# Patient Record
Sex: Female | Born: 1975 | Race: White | Hispanic: No | Marital: Married | Smoking: Never smoker
Health system: Southern US, Community
[De-identification: ages and names within clinical notes are randomized; demographics above are authoritative.]

## PROBLEM LIST (undated history)

## (undated) DIAGNOSIS — G43109 Migraine with aura, not intractable, without status migrainosus: Secondary | ICD-10-CM

## (undated) DIAGNOSIS — E669 Obesity, unspecified: Secondary | ICD-10-CM

## (undated) DIAGNOSIS — F988 Other specified behavioral and emotional disorders with onset usually occurring in childhood and adolescence: Secondary | ICD-10-CM

## (undated) HISTORY — DX: Other specified behavioral and emotional disorders with onset usually occurring in childhood and adolescence: F98.8

## (undated) HISTORY — DX: Migraine with aura, not intractable, without status migrainosus: G43.109

## (undated) HISTORY — DX: Obesity, unspecified: E66.9

---

## 2005-09-08 ENCOUNTER — Ambulatory Visit (HOSPITAL_COMMUNITY): Admission: RE | Admit: 2005-09-08 | Discharge: 2005-09-08 | Payer: Self-pay | Admitting: Obstetrics and Gynecology

## 2005-11-28 ENCOUNTER — Inpatient Hospital Stay (HOSPITAL_COMMUNITY): Admission: RE | Admit: 2005-11-28 | Discharge: 2005-11-28 | Payer: Self-pay | Admitting: Obstetrics and Gynecology

## 2005-12-07 ENCOUNTER — Inpatient Hospital Stay (HOSPITAL_COMMUNITY): Admission: AD | Admit: 2005-12-07 | Discharge: 2005-12-07 | Payer: Self-pay | Admitting: Obstetrics and Gynecology

## 2005-12-08 ENCOUNTER — Inpatient Hospital Stay (HOSPITAL_COMMUNITY): Admission: AD | Admit: 2005-12-08 | Discharge: 2005-12-10 | Payer: Self-pay | Admitting: Obstetrics & Gynecology

## 2006-01-14 ENCOUNTER — Other Ambulatory Visit: Admission: RE | Admit: 2006-01-14 | Discharge: 2006-01-14 | Payer: Self-pay | Admitting: Obstetrics and Gynecology

## 2007-02-25 ENCOUNTER — Ambulatory Visit (HOSPITAL_COMMUNITY): Admission: AD | Admit: 2007-02-25 | Discharge: 2007-02-25 | Payer: Self-pay | Admitting: Obstetrics & Gynecology

## 2007-05-23 ENCOUNTER — Inpatient Hospital Stay (HOSPITAL_COMMUNITY): Admission: AD | Admit: 2007-05-23 | Discharge: 2007-05-25 | Payer: Self-pay | Admitting: Obstetrics and Gynecology

## 2007-07-21 LAB — HM PAP SMEAR

## 2011-08-04 LAB — CBC
HCT: 35.9 — ABNORMAL LOW
HCT: 37.9
MCHC: 34.7
MCV: 88.5
MCV: 88.9
Platelets: 106 — ABNORMAL LOW
Platelets: 129 — ABNORMAL LOW
Platelets: 99 — ABNORMAL LOW
WBC: 11.4 — ABNORMAL HIGH
WBC: 9.7

## 2011-08-04 LAB — RH IMMUNE GLOB WKUP(>/=20WKS)(NOT WOMEN'S HOSP)

## 2011-08-04 LAB — RPR: RPR Ser Ql: NONREACTIVE

## 2011-08-04 LAB — RAPID HIV SCREEN (WH-MAU): Rapid HIV Screen: NONREACTIVE

## 2012-03-04 ENCOUNTER — Other Ambulatory Visit (HOSPITAL_BASED_OUTPATIENT_CLINIC_OR_DEPARTMENT_OTHER): Payer: Self-pay | Admitting: Family Medicine

## 2012-03-04 ENCOUNTER — Ambulatory Visit (HOSPITAL_BASED_OUTPATIENT_CLINIC_OR_DEPARTMENT_OTHER)
Admission: RE | Admit: 2012-03-04 | Discharge: 2012-03-04 | Disposition: A | Discharge status: Home / Self Care | Payer: 59 | Source: Ambulatory Visit | Attending: Family Medicine | Admitting: Family Medicine

## 2012-03-04 DIAGNOSIS — R05 Cough: Secondary | ICD-10-CM | POA: Insufficient documentation

## 2012-03-04 DIAGNOSIS — R059 Cough, unspecified: Secondary | ICD-10-CM | POA: Insufficient documentation

## 2012-11-03 ENCOUNTER — Other Ambulatory Visit: Payer: Self-pay | Admitting: Obstetrics and Gynecology

## 2012-11-03 DIAGNOSIS — R928 Other abnormal and inconclusive findings on diagnostic imaging of breast: Secondary | ICD-10-CM

## 2012-11-11 ENCOUNTER — Ambulatory Visit
Admission: RE | Admit: 2012-11-11 | Discharge: 2012-11-11 | Disposition: A | Discharge status: Home / Self Care | Payer: Managed Care, Other (non HMO) | Source: Ambulatory Visit | Attending: Obstetrics and Gynecology | Admitting: Obstetrics and Gynecology

## 2012-11-11 DIAGNOSIS — R928 Other abnormal and inconclusive findings on diagnostic imaging of breast: Secondary | ICD-10-CM

## 2013-01-17 ENCOUNTER — Telehealth: Payer: Self-pay | Admitting: *Deleted

## 2013-01-17 NOTE — Telephone Encounter (Signed)
PT DID SOMETHING TO HER BACK AND SHE WANTED TO KNOW IF SHE NEEDS TO BE REF. TO  A SPEC.?

## 2013-01-17 NOTE — Telephone Encounter (Signed)
Pt needed an appt. She will be seen tomorrow at 10:30 for her back pain. PG

## 2013-01-18 ENCOUNTER — Encounter: Payer: Self-pay | Admitting: Family Medicine

## 2013-01-18 ENCOUNTER — Ambulatory Visit (INDEPENDENT_AMBULATORY_CARE_PROVIDER_SITE_OTHER): Payer: Managed Care, Other (non HMO) | Admitting: Family Medicine

## 2013-01-18 VITALS — BP 102/70 | HR 66 | Wt 189.0 lb

## 2013-01-18 DIAGNOSIS — M549 Dorsalgia, unspecified: Secondary | ICD-10-CM

## 2013-01-18 MED ORDER — TIZANIDINE HCL 4 MG PO TABS: 4.0000 mg | ORAL_TABLET | Freq: Three times a day (TID) | ORAL | Status: DC

## 2013-01-18 MED ORDER — METHYLPREDNISOLONE 4 MG PO KIT: PACK | ORAL | Status: DC

## 2013-01-18 MED ORDER — TRAMADOL HCL 50 MG PO TABS: ORAL_TABLET | ORAL | Status: DC

## 2013-01-18 MED ORDER — KETOROLAC TROMETHAMINE 60 MG/2ML IM SOLN
60.0000 mg | Freq: Once | INTRAMUSCULAR | Status: AC
  Administered 2013-01-18: 60 mg via INTRAMUSCULAR

## 2013-01-18 MED ORDER — LIDOCAINE 5 % EX PTCH: MEDICATED_PATCH | CUTANEOUS | Status: DC

## 2013-01-18 MED ORDER — NABUMETONE 750 MG PO TABS: 750.0000 mg | ORAL_TABLET | Freq: Two times a day (BID) | ORAL | Status: DC

## 2013-01-18 MED ORDER — DICLOFENAC SODIUM 1 % TD GEL: TRANSDERMAL | Status: DC

## 2013-01-18 MED ORDER — METHYLPREDNISOLONE SODIUM SUCC 125 MG IJ SOLR
125.0000 mg | Freq: Once | INTRAMUSCULAR | Status: AC
  Administered 2013-01-18: 125 mg via INTRAMUSCULAR

## 2013-01-18 MED ORDER — METHYLPREDNISOLONE SODIUM SUCC 125 MG IJ SOLR: 125.0000 mg | Freq: Once | INTRAMUSCULAR | Status: DC

## 2013-01-18 NOTE — Progress Notes (Signed)
Subjective:     Patient ID: Anne Knight, female   DOB: 27-Nov-1975, 37 y.o.   MRN: [id number]  HPI Falicity is here today complaining of back pain.  She has been struggling with this problem for several days.  She describes her pain as sharp and severe in nature and it happens intermittently.  She has taken Motrin for her pain which has not helped her.   Review of Systems  Musculoskeletal: Positive for back pain. Negative for gait problem.  Neurological: Negative for weakness and numbness.       Objective:   Physical Exam  Musculoskeletal:       Lumbar back: She exhibits tenderness (Pain is greater in the area of the left SI joint ). She exhibits no swelling.       Assessment:     Back Pain     Plan:     She received injections of Ketorolac and Solu-Medrol. She was given prescriptions for several medications to try first and then go to if needed. If she is not better in 1 week, we may send her for PT then later for a MRI if she does not improve.

## 2013-01-18 NOTE — Patient Instructions (Addendum)
1)  Back Pain - You received injections of a steroid and an anti-inflammatory medication today therefore you won't take the Relafen today.  You can take the muscle relaxer (Tizanidine) along with Tylenol 1000 mg up to 3 times per day.  Soak in a hot tub with 2-3 cups of Epsom salt and do gentle stretching.  You have also been given other meds to take if needed in the future  A)  Voltaren Gel - This is an anti-inflammatory so interchange it with the Relafen.  B)  Medrol Dose Pak - This is a steroid Dose Pak you can take for 6 days.    C)  Lidoderm Patches - Pain  D)  Tramadol - Start with 1 tab at night for pain and increase slowly up to 3 if needed.    Thermacare Patches for Back  Back Magic    Back Exercises Back exercises help treat and prevent back injuries. The goal of back exercises is to increase the strength of your abdominal and back muscles and the flexibility of your back. These exercises should be started when you no longer have back pain. Back exercises include:  Pelvic Tilt. Lie on your back with your knees bent. Tilt your pelvis until the lower part of your back is against the floor. Hold this position 5 to 10 sec and repeat 5 to 10 times.  Knee to Chest. Pull first 1 knee up against your chest and hold for 20 to 30 seconds, repeat this with the other knee, and then both knees. This may be done with the other leg straight or bent, whichever feels better.  Sit-Ups or Curl-Ups. Bend your knees 90 degrees. Start with tilting your pelvis, and do a partial, slow sit-up, lifting your trunk only 30 to 45 degrees off the floor. Take at least 2 to 3 seconds for each sit-up. Do not do sit-ups with your knees out straight. If partial sit-ups are difficult, simply do the above but with only tightening your abdominal muscles and holding it as directed.  Hip-Lift. Lie on your back with your knees flexed 90 degrees. Push down with your feet and shoulders as you raise your hips a couple inches  off the floor; hold for 10 seconds, repeat 5 to 10 times.  Back arches. Lie on your stomach, propping yourself up on bent elbows. Slowly press on your hands, causing an arch in your low back. Repeat 3 to 5 times. Any initial stiffness and discomfort should lessen with repetition over time.  Shoulder-Lifts. Lie face down with arms beside your body. Keep hips and torso pressed to floor as you slowly lift your head and shoulders off the floor. Do not overdo your exercises, especially in the beginning. Exercises may cause you some mild back discomfort which lasts for a few minutes; however, if the pain is more severe, or lasts for more than 15 minutes, do not continue exercises until you see your caregiver. Improvement with exercise therapy for back problems is slow.  See your caregivers for assistance with developing a proper back exercise program. Document Released: 11/13/2004 Document Revised: 12/29/2011 Document Reviewed: 08/07/2011 [facility] Patient Information 2013 [place], [place].

## 2013-01-19 DIAGNOSIS — M549 Dorsalgia, unspecified: Secondary | ICD-10-CM | POA: Insufficient documentation

## 2013-08-23 ENCOUNTER — Ambulatory Visit (INDEPENDENT_AMBULATORY_CARE_PROVIDER_SITE_OTHER): Payer: Managed Care, Other (non HMO) | Admitting: Family Medicine

## 2013-08-23 ENCOUNTER — Encounter: Payer: Self-pay | Admitting: Family Medicine

## 2013-08-23 VITALS — BP 103/71 | HR 73 | Resp 16 | Ht 64.0 in | Wt 198.0 lb

## 2013-08-23 DIAGNOSIS — R41840 Attention and concentration deficit: Secondary | ICD-10-CM | POA: Insufficient documentation

## 2013-08-23 DIAGNOSIS — E785 Hyperlipidemia, unspecified: Secondary | ICD-10-CM

## 2013-08-23 DIAGNOSIS — G43909 Migraine, unspecified, not intractable, without status migrainosus: Secondary | ICD-10-CM

## 2013-08-23 DIAGNOSIS — R635 Abnormal weight gain: Secondary | ICD-10-CM | POA: Insufficient documentation

## 2013-08-23 DIAGNOSIS — Z5181 Encounter for therapeutic drug level monitoring: Secondary | ICD-10-CM

## 2013-08-23 MED ORDER — LISDEXAMFETAMINE DIMESYLATE 70 MG PO CAPS: 70.0000 mg | ORAL_CAPSULE | ORAL | Status: DC

## 2013-08-23 MED ORDER — TOPIRAMATE ER 50 MG PO CAP24: 1.0000 | ORAL_CAPSULE | Freq: Every day | ORAL | Status: DC

## 2013-08-23 NOTE — Assessment & Plan Note (Signed)
Refilled her Vyvanse 

## 2013-08-23 NOTE — Assessment & Plan Note (Signed)
Checking a lipid panel.  

## 2013-08-23 NOTE — Assessment & Plan Note (Signed)
Checking a CBC and CMET.

## 2013-08-23 NOTE — Patient Instructions (Signed)
1)  ADD - Continue on Vyvanse.  2)  Migraine Prevention   A)  Trokendi XR - Start with the 50 mg.  If you feel "foggy" try adding some L-tyrosine.  B)  Migrelief +/- Lysine   Migraine Headache A migraine headache is an intense, throbbing pain on one or both sides of your head. A migraine can last for 30 minutes to several hours. CAUSES  The exact cause of a migraine headache is not always known. However, a migraine may be caused when nerves in the brain become irritated and release chemicals that cause inflammation. This causes pain. SYMPTOMS  Pain on one or both sides of your head.  Pulsating or throbbing pain.  Severe pain that prevents daily activities.  Pain that is aggravated by any physical activity.  Nausea, vomiting, or both.  Dizziness.  Pain with exposure to bright lights, loud noises, or activity.  General sensitivity to bright lights, loud noises, or smells. Before you get a migraine, you may get warning signs that a migraine is coming (aura). An aura may include:  Seeing flashing lights.  Seeing bright spots, halos, or zig-zag lines.  Having tunnel vision or blurred vision.  Having feelings of numbness or tingling.  Having trouble talking.  Having muscle weakness. MIGRAINE TRIGGERS  Alcohol.  Smoking.  Stress.  Menstruation.  Aged cheeses.  Foods or drinks that contain nitrates, glutamate, aspartame, or tyramine.  Lack of sleep.  Chocolate.  Caffeine.  Hunger.  Physical exertion.  Fatigue.  Medicines used to treat chest pain (nitroglycerine), birth control pills, estrogen, and some blood pressure medicines. DIAGNOSIS  A migraine headache is often diagnosed based on:  Symptoms.  Physical examination.  A CT scan or MRI of your head. TREATMENT Medicines may be given for pain and nausea. Medicines can also be given to help prevent recurrent migraines.  HOME CARE INSTRUCTIONS  Only take over-the-counter or prescription medicines  for pain or discomfort as directed by your caregiver. The use of long-term narcotics is not recommended.  Lie down in a dark, quiet room when you have a migraine.  Keep a journal to find out what may trigger your migraine headaches. For example, write down:  What you eat and drink.  How much sleep you get.  Any change to your diet or medicines.  Limit alcohol consumption.  Quit smoking if you smoke.  Get 7 to 9 hours of sleep, or as recommended by your caregiver.  Limit stress.  Keep lights dim if bright lights bother you and make your migraines worse. SEEK IMMEDIATE MEDICAL CARE IF:   Your migraine becomes severe.  You have a fever.  You have a stiff neck.  You have vision loss.  You have muscular weakness or loss of muscle control.  You start losing your balance or have trouble walking.  You feel faint or pass out.  You have severe symptoms that are different from your first symptoms. MAKE SURE YOU:   Understand these instructions.  Will watch your condition.  Will get help right away if you are not doing well or get worse. Document Released: 10/06/2005 Document Revised: 12/29/2011 Document Reviewed: 09/26/2011 [facility] Patient Information 2014 [place], [place].

## 2013-08-23 NOTE — Assessment & Plan Note (Signed)
Checking a TSH.   

## 2013-08-23 NOTE — Progress Notes (Signed)
  Subjective:    Patient ID: Anne Knight, female    DOB: 03/31/1976, 37 y.o.   MRN: [id number]  HPI  Anne Knight is here today to get a refill on her ADD medication (Vyvanse 70 mg).  She says that the medication continues to help her concentrate at work.  She feels that this dosage is appropriate and would like to continue on it.  She is no longer taking the Topamax.  She says that it makes her have "brain fog".     Review of Systems  Constitutional: Positive for unexpected weight change.  HENT: Negative.   Eyes: Negative.   Respiratory: Negative.   Cardiovascular: Negative.   Gastrointestinal: Negative.   Endocrine: Negative.   Genitourinary: Negative.   Musculoskeletal: Negative.   Skin: Negative.   Allergic/Immunologic: Negative.   Neurological: Negative.   Hematological: Negative.   Psychiatric/Behavioral: Positive for decreased concentration.     Past Medical History  Diagnosis Date  . Migraine headache with aura   . ADD (attention deficit disorder)   . Obesity     Family History  Problem Relation Age of Onset  . Heart disease Maternal Grandmother   . Heart disease Paternal Grandfather      History   Social History Narrative   Marital Status:  Married ([place])   Children:  Sons (Carter/Brandon)     Pets: Dog (Buddy)    Living Situation: Lives with spouse and sons.    Occupation: Pharmacist, hospital)    Education:  Psychologist, forensic) [employer]   Tobacco Use/Exposure:  None    Alcohol Use:  Occasional (Wine/Beer)    Drug Use:  None   Diet:  Regular   Exercise:  Cardio (3 x per week)    Hobbies: Sports, Reading              Objective:   Physical Exam  Vitals reviewed. Constitutional: She is oriented to person, place, and time. She appears well-developed and well-nourished.  Eyes: Conjunctivae are normal. No scleral icterus.  Neck: Neck supple. No thyromegaly present.  Cardiovascular: Normal rate, regular rhythm and  normal heart sounds.   Pulmonary/Chest: Effort normal and breath sounds normal.  Musculoskeletal: She exhibits no edema and no tenderness.  Lymphadenopathy:    She has no cervical adenopathy.  Neurological: She is alert and oriented to person, place, and time.  Skin: Skin is warm and dry.  Psychiatric: She has a normal mood and affect. Her behavior is normal. Judgment and thought content normal.          Assessment & Plan:

## 2013-08-23 NOTE — Assessment & Plan Note (Signed)
She is going to try some Trokendi XR 50 mg vs Migrelief and L-Tyrosine.

## 2013-11-16 IMAGING — US US BREAST*R*
1 series · 4 of 4 positions shown · non-contrast
Comparison: October 28, 2012

CLINICAL DATA: Called back from screening mammogram for possible
mass right breast

DIGITAL DIAGNOSTIC RIGHT MAMMOGRAM November 11, 2012 AND RIGHT
BREAST ULTRASOUND:

[Series 1: us breast*right* · 4 of 4 slices shown]
[im 1/4]
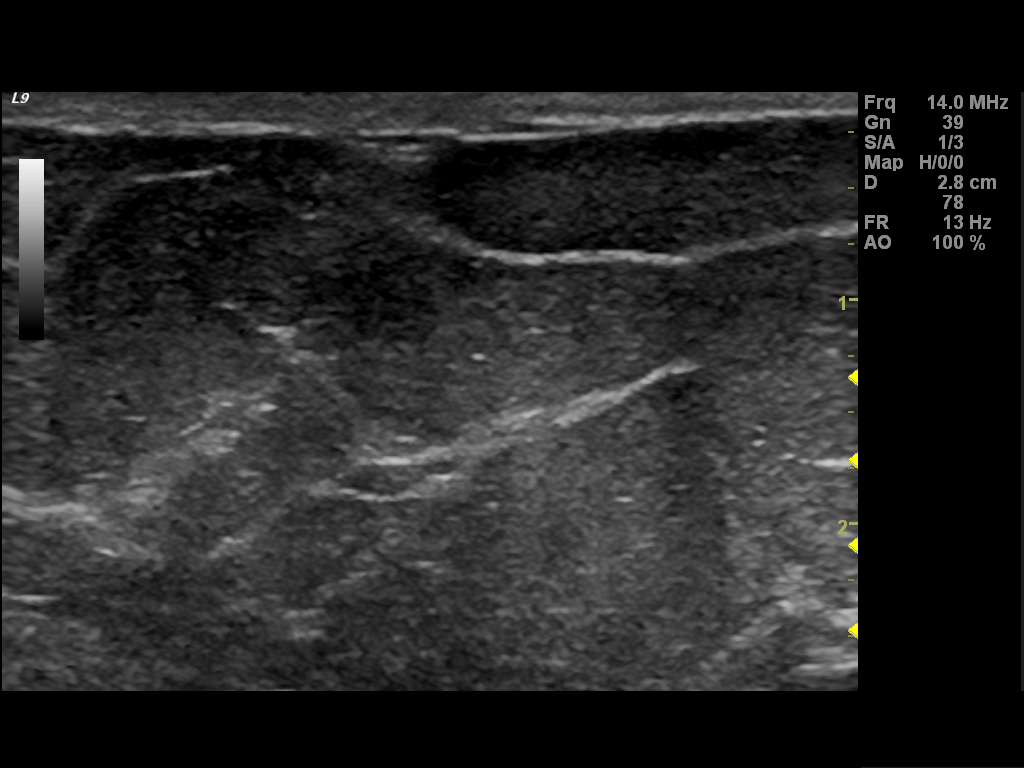
[im 2/4]
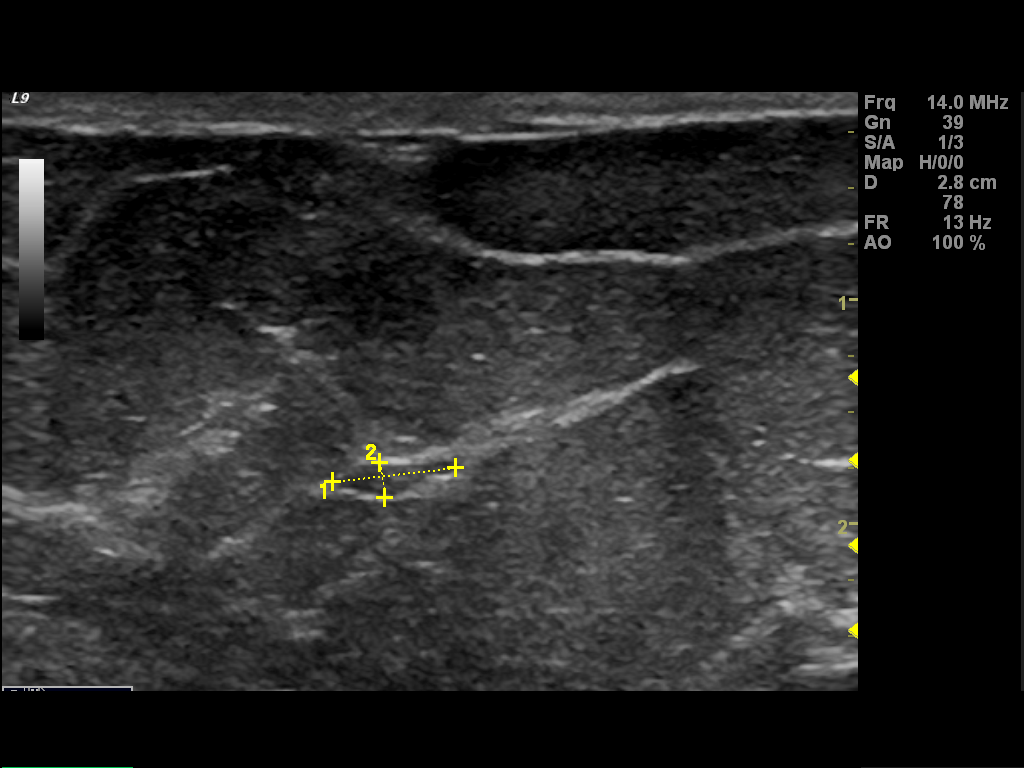
[im 3/4]
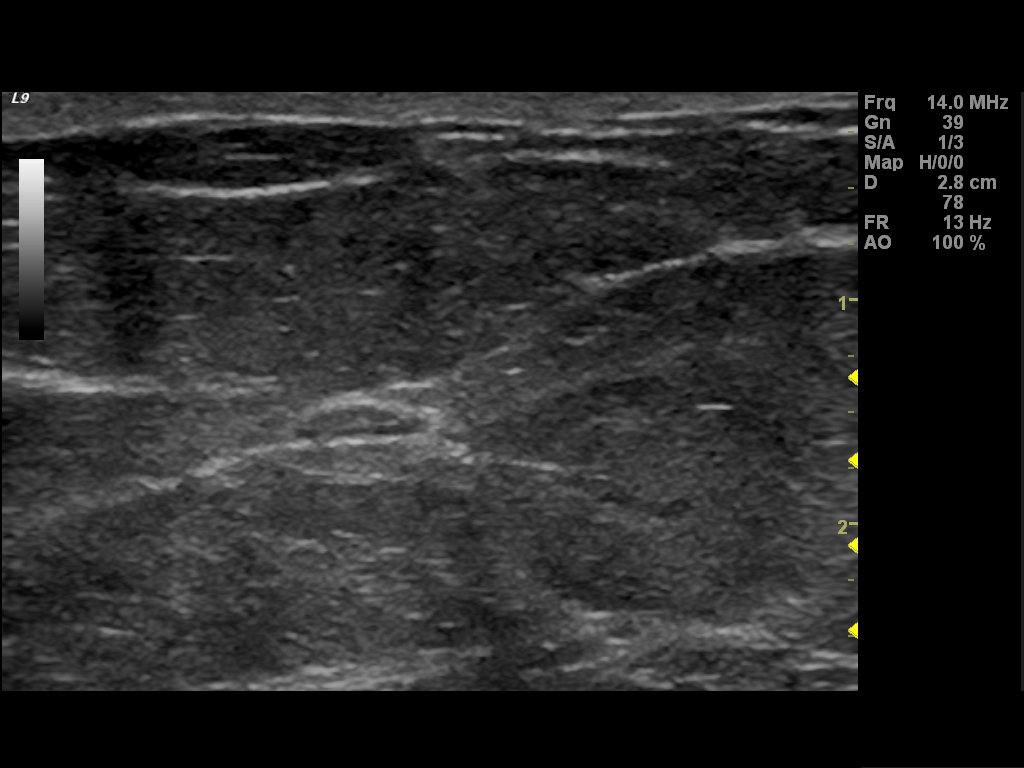
[im 4/4]
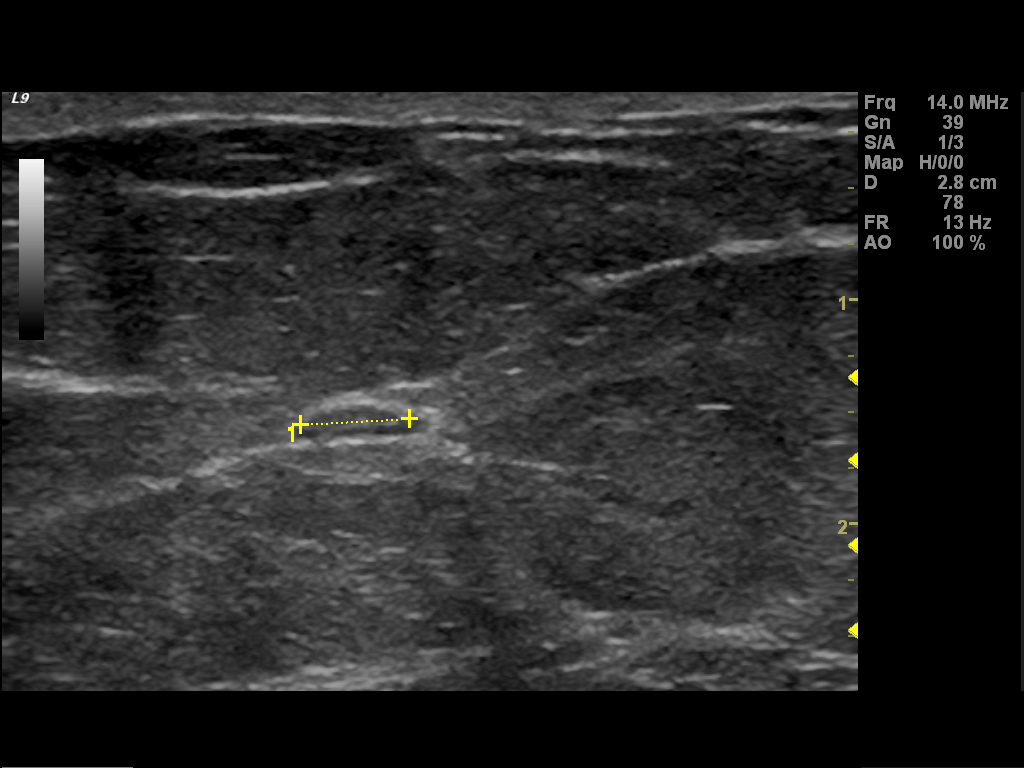

[4 of 4 positions shown; findings below may reference images not displayed]

FINDINGS: ACR Breast Density Category scattered fibroglandular tissue

Spot compression CC and MLO views of the right breast are
submitted.  Previously questioned asymmetry in the slightly medial
right breast persists.

 showing an oval hypoechoic lesion with central increased echo
texture at the right breast three o'clock subareolar measuring
x 0.16 x 0.49 cm.  This correlates to the mammographic finding and
is consistent with intramammary lymph node.
IMPRESSION: Benign findings.

RECOMMENDATION:
Routine screening mammogram at age 40.

I have discussed the findings and recommendations with the patient.
Results were also provided in writing at the conclusion of the
visit.

BI-RADS CATEGORY 2:  Benign finding(s).

## 2013-11-16 IMAGING — MG MM DIGITAL DIAGNOSTIC UNILAT*R*
2 series · 2 of 2 positions shown · non-contrast
Comparison: October 28, 2012

CLINICAL DATA: Called back from screening mammogram for possible
mass right breast

DIGITAL DIAGNOSTIC RIGHT MAMMOGRAM November 11, 2012 AND RIGHT
BREAST ULTRASOUND:

[R CC]
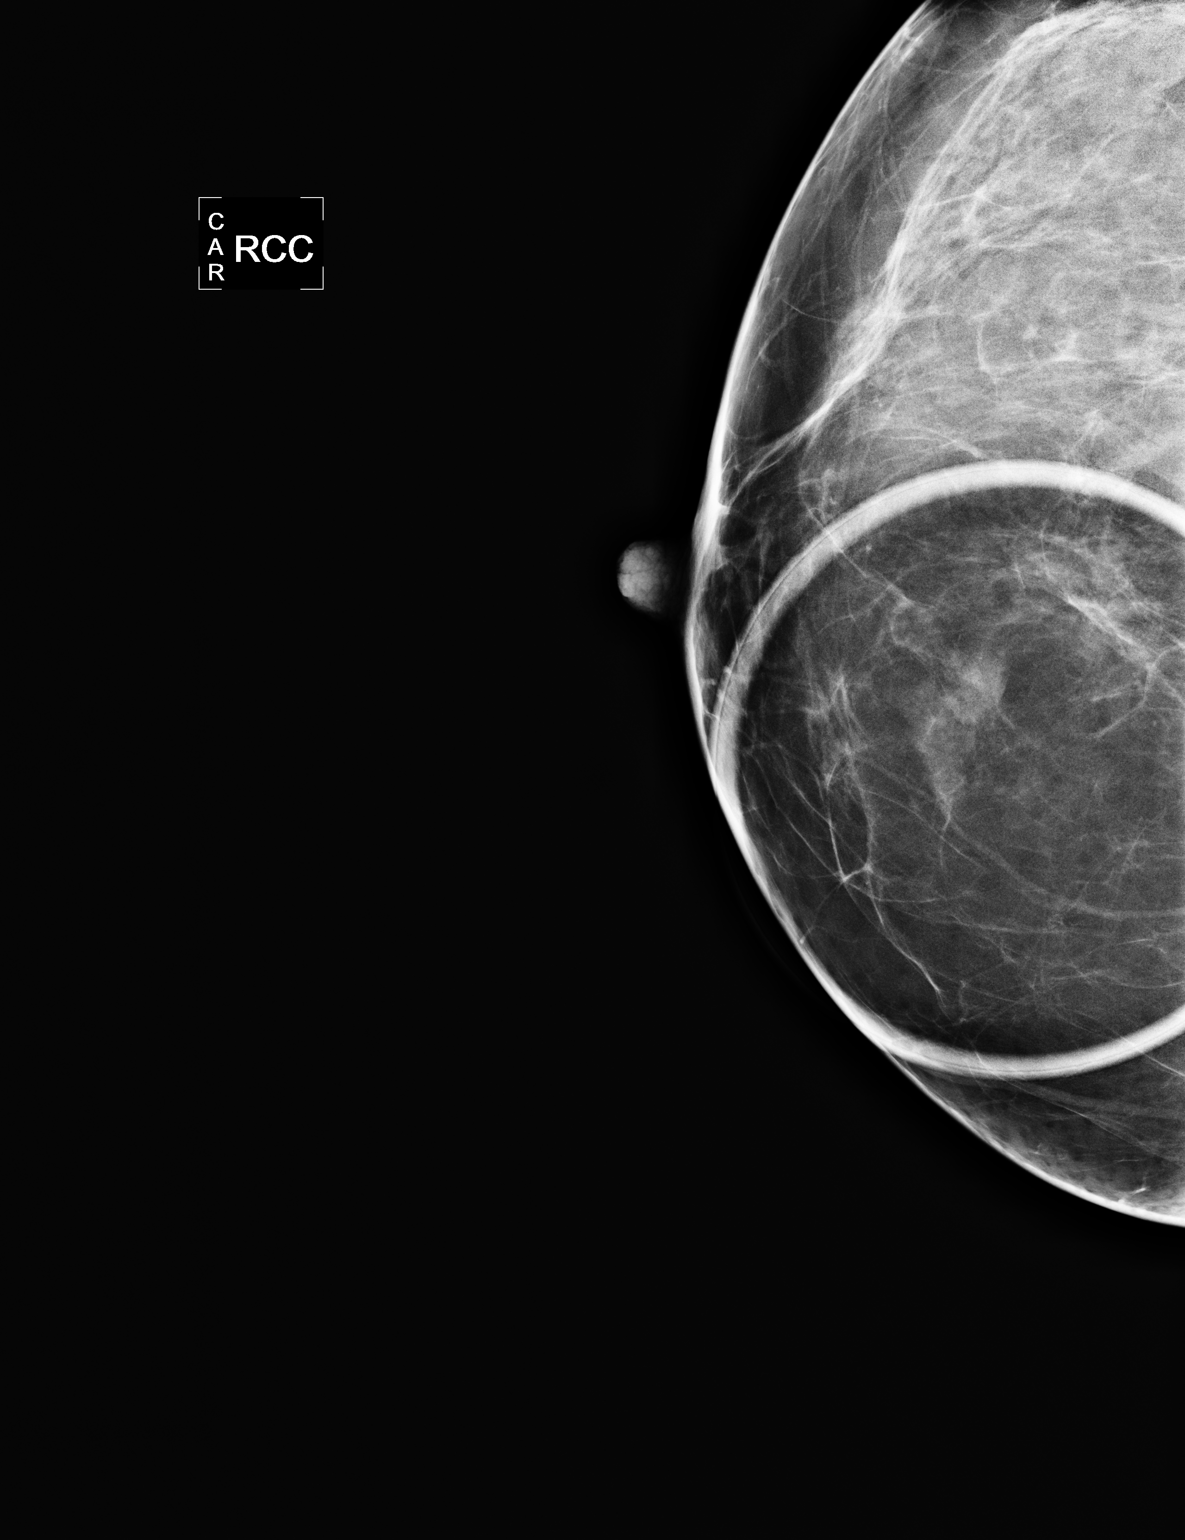

[R MLO]
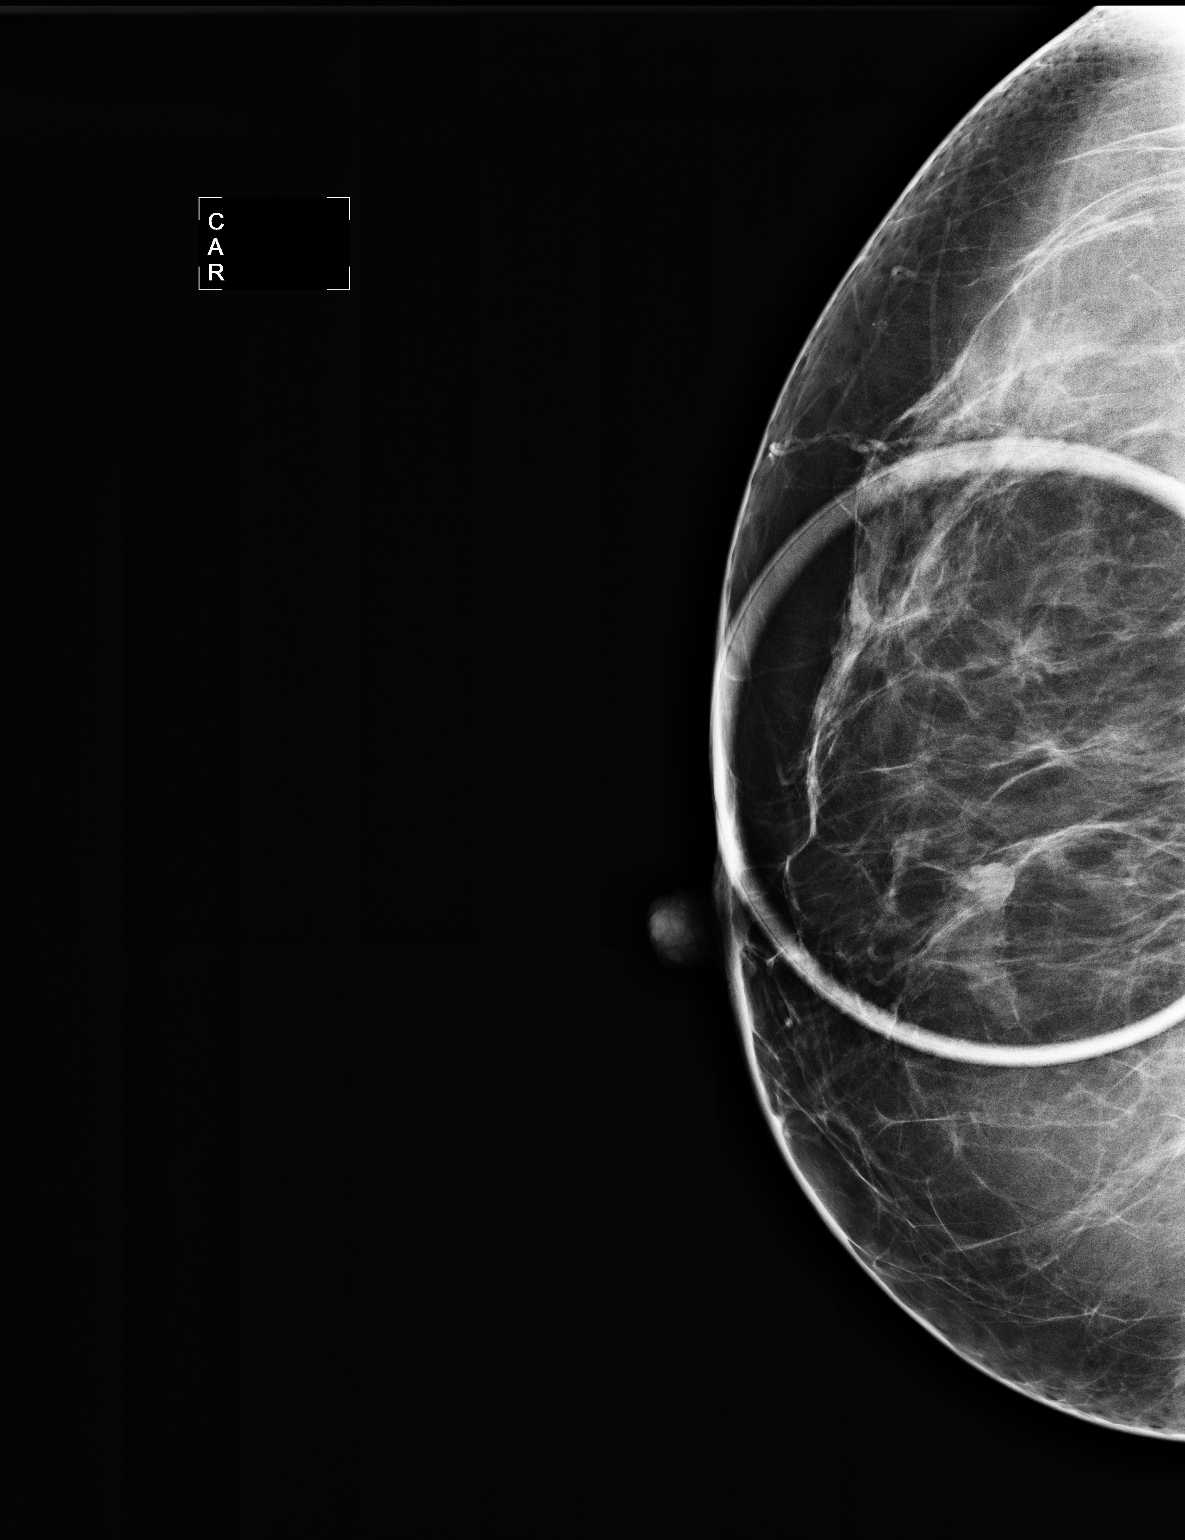

[2 of 2 positions shown; findings below may reference images not displayed]

FINDINGS: ACR Breast Density Category scattered fibroglandular tissue

Spot compression CC and MLO views of the right breast are
submitted.  Previously questioned asymmetry in the slightly medial
right breast persists.

 showing an oval hypoechoic lesion with central increased echo
texture at the right breast three o'clock subareolar measuring
x 0.16 x 0.49 cm.  This correlates to the mammographic finding and
is consistent with intramammary lymph node.
IMPRESSION: Benign findings.

RECOMMENDATION:
Routine screening mammogram at age 40.

I have discussed the findings and recommendations with the patient.
Results were also provided in writing at the conclusion of the
visit.

BI-RADS CATEGORY 2:  Benign finding(s).

## 2013-12-15 ENCOUNTER — Other Ambulatory Visit: Payer: Self-pay | Admitting: Family Medicine

## 2013-12-15 DIAGNOSIS — Z20828 Contact with and (suspected) exposure to other viral communicable diseases: Secondary | ICD-10-CM

## 2013-12-15 MED ORDER — OSELTAMIVIR PHOSPHATE 75 MG PO CAPS
75.0000 mg | ORAL_CAPSULE | Freq: Every day | ORAL | Status: AC
Start: 2013-12-15 — End: 2013-12-24

## 2013-12-15 NOTE — Telephone Encounter (Signed)
Anne Knight's husband was diagnosed with influenza yesterday so we are going to treat her prophylactically with Tamiflu for 10 days.  RZ

## 2013-12-29 ENCOUNTER — Ambulatory Visit (INDEPENDENT_AMBULATORY_CARE_PROVIDER_SITE_OTHER): Payer: Managed Care, Other (non HMO) | Admitting: Family Medicine

## 2013-12-29 ENCOUNTER — Encounter (INDEPENDENT_AMBULATORY_CARE_PROVIDER_SITE_OTHER): Payer: Self-pay

## 2013-12-29 ENCOUNTER — Encounter: Payer: Self-pay | Admitting: Family Medicine

## 2013-12-29 VITALS — BP 115/74 | HR 70 | Resp 16 | Wt 200.0 lb

## 2013-12-29 DIAGNOSIS — G43909 Migraine, unspecified, not intractable, without status migrainosus: Secondary | ICD-10-CM

## 2013-12-29 DIAGNOSIS — R41840 Attention and concentration deficit: Secondary | ICD-10-CM

## 2013-12-29 MED ORDER — IBUPROFEN-FAMOTIDINE 800-26.6 MG PO TABS: 1.0000 | ORAL_TABLET | Freq: Three times a day (TID) | ORAL | Status: DC

## 2013-12-29 MED ORDER — BUTALBITAL-ACETAMINOPHEN 50-300 MG PO TABS: 1.0000 | ORAL_TABLET | Freq: Three times a day (TID) | ORAL | Status: DC

## 2013-12-29 MED ORDER — MAGNESIUM 500 MG PO TABS
1.0000 | ORAL_TABLET | Freq: Every day | ORAL | Status: AC
Start: 2013-12-29 — End: 2014-12-30

## 2013-12-29 MED ORDER — LISDEXAMFETAMINE DIMESYLATE 70 MG PO CAPS: 70.0000 mg | ORAL_CAPSULE | ORAL | Status: DC

## 2013-12-29 NOTE — Progress Notes (Signed)
   Subjective:    Patient ID: Anne Knight, female    DOB: 11-Jan-1976, 38 y.o.   MRN: [id number]  HPI  Shawna OrleansMelanie is here today to discuss the following conditions:    1)  ADD - She needs to have her Vyvanse 70 mg refilled.  She says that it continues to help her concentrate at work.  She feels that this dosage is appropriate and would like to continue on it.    2)  Migraine Headaches - She has been having worsening headaches.  She thinks that they may be related to her being under more stress at work.     Review of Systems  Constitutional: Positive for appetite change. Negative for fatigue and unexpected weight change.  Cardiovascular: Negative for chest pain and palpitations.  Neurological: Positive for headaches. Negative for dizziness.  Psychiatric/Behavioral: Negative for behavioral problems, sleep disturbance, dysphoric mood, decreased concentration and agitation. The patient is not nervous/anxious and is not hyperactive.      Past Medical History  Diagnosis Date  . Migraine headache with aura   . ADD (attention deficit disorder)   . Obesity      History   Social History Narrative   Marital Status:  Married ([place])   Children:  Sons (Carter/Brandon)     Pets: Dog (Buddy)    Living Situation: Lives with spouse and sons.    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 Heart disease Maternal Grandmother   . Heart disease Paternal Grandfather      Current Outpatient Prescriptions on File Prior to Visit  Medication Sig Dispense Refill  . MICROGESTIN 1-20 MG-MCG tablet       . Topiramate ER (TROKENDI XR) 50 MG CP24 Take 1 capsule by mouth daily.  90 capsule  3   No  current facility-administered medications on file prior to visit.     No Known Allergies   Immunization History  Administered Date(s) Administered  . Tdap 04/17/2009      Objective:   Physical Exam  Constitutional: She appears well-nourished. No distress.  Cardiovascular: Normal rate, regular rhythm and normal heart sounds.   Pulmonary/Chest: Effort normal and breath sounds normal.  Neurological: She is alert.  Psychiatric: She has a normal mood and affect. Her behavior is normal. Judgment and thought content normal.          Assessment & Plan:

## 2013-12-29 NOTE — Patient Instructions (Signed)
1)  Migraine -   Prevention - Magnesium 250 mg - 500 mg daily Treatment - Bupap + Duexis   2)  Attention -   Vyvanse up to 70 mg daily.   Recurrent Migraine Headache A migraine headache is an intense, throbbing pain on one or both sides of your head. Recurrent migraines keep coming back. A migraine can last for 30 minutes to several hours. CAUSES  The exact cause of a migraine headache is not always known. However, a migraine may be caused when nerves in the brain become irritated and release chemicals that cause inflammation. This causes pain. Certain things may also trigger migraines, such as:   Alcohol.  Smoking.  Stress.  Menstruation.  Aged cheeses.  Foods or drinks that contain nitrates, glutamate, aspartame, or tyramine.  Lack of sleep.  Chocolate.  Caffeine.  Hunger.  Physical exertion.  Fatigue.  Medicines used to treat chest pain (nitroglycerine), birth control pills, estrogen, and some blood pressure medicines. SYMPTOMS   Pain on one or both sides of your head.  Pulsating or throbbing pain.  Severe pain that prevents daily activities.  Pain that is aggravated by any physical activity.  Nausea, vomiting, or both.  Dizziness.  Pain with exposure to bright lights, loud noises, or activity.  General sensitivity to bright lights, loud noises, or smells. Before you get a migraine, you may get warning signs that a migraine is coming (aura). An aura may include:  Seeing flashing lights.  Seeing bright spots, halos, or zig-zag lines.  Having tunnel vision or blurred vision.  Having feelings of numbness or tingling.  Having trouble talking.  Having muscle weakness. DIAGNOSIS  A recurrent migraine headache is often diagnosed based on:  Symptoms.  Physical examination.  A CT scan or MRI of your head. These imaging tests cannot diagnose migraines, but can help rule out other causes of headaches.  TREATMENT  Medicines may be given for pain  and nausea. Medicines can also be given to help prevent recurrent migraines. HOME CARE INSTRUCTIONS  Only take over-the-counter or prescription medicines for pain or discomfort as directed by your health care provider. The use of long-term narcotics is not recommended.  Lie down in a dark, quiet room when you have a migraine.  Keep a journal to find out what may trigger your migraine headaches. For example, write down:  What you eat and drink.  How much sleep you get.  Any change to your diet or medicines.  Limit alcohol consumption.  Quit smoking if you smoke.  Get 7 9 hours of sleep, or as recommended by your health care provider.  Limit stress.  Keep lights dim if bright lights bother you and make your migraines worse. SEEK MEDICAL CARE IF:   You do not get relief from the medicines given to you.  You have a recurrence of pain. SEEK IMMEDIATE MEDICAL CARE IF:  Your migraine becomes severe.  You have a fever.  You have a stiff neck.  You have loss of vision.  You have muscular weakness or loss of muscle control.  You start losing your balance or have trouble walking.  You feel faint or pass out.  You have severe symptoms that are different from your first symptoms. MAKE SURE YOU:   Understand these instructions.  Will watch your condition.  Will get help right away if you are not doing well or get worse. Document Released: 07/01/2001 Document Revised: 07/27/2013 Document Reviewed: 06/13/2013 [facility] Patient Information 2014 [place].

## 2013-12-29 NOTE — Assessment & Plan Note (Signed)
Refilled her Vyvanse

## 2014-05-16 ENCOUNTER — Encounter: Payer: Self-pay | Admitting: Family Medicine

## 2014-05-16 ENCOUNTER — Ambulatory Visit (INDEPENDENT_AMBULATORY_CARE_PROVIDER_SITE_OTHER): Payer: Managed Care, Other (non HMO) | Admitting: Family Medicine

## 2014-05-16 VITALS — BP 118/72 | HR 68 | Resp 16 | Wt 205.0 lb

## 2014-05-16 DIAGNOSIS — R21 Rash and other nonspecific skin eruption: Secondary | ICD-10-CM

## 2014-05-16 DIAGNOSIS — R41840 Attention and concentration deficit: Secondary | ICD-10-CM

## 2014-05-16 MED ORDER — CLOBETASOL PROPIONATE 0.05 % EX FOAM: Freq: Two times a day (BID) | CUTANEOUS | Status: AC

## 2014-05-16 MED ORDER — LISDEXAMFETAMINE DIMESYLATE 40 MG PO CAPS: 40.0000 mg | ORAL_CAPSULE | ORAL | Status: AC

## 2014-05-16 MED ORDER — METHYLPREDNISOLONE SODIUM SUCC 125 MG IJ SOLR
125.0000 mg | Freq: Once | INTRAMUSCULAR | Status: AC
  Administered 2014-05-16: 125 mg via INTRAMUSCULAR

## 2014-05-16 MED ORDER — LISDEXAMFETAMINE DIMESYLATE 40 MG PO CAPS: 40.0000 mg | ORAL_CAPSULE | ORAL | Status: DC

## 2014-05-16 MED ORDER — PREDNISONE (PAK) 10 MG PO TABS: ORAL_TABLET | Freq: Every day | ORAL | Status: DC

## 2014-05-16 NOTE — Progress Notes (Signed)
   Subjective:    Patient ID: Bonnye FavaMelanie L Edison, female    DOB: Jan 01, 1976, 38 y.o.   MRN: [id number]  HPI  Shawna OrleansMelanie is here today complaining of a rash on her legs and waist. She is pretty sure it is posion ivy. It first started about a week ago and it is now spreading. She tried some OTC creams which have not helped.      Review of Systems  Skin:       Rash rash on legs and waist  All other systems reviewed and are negative.    Past Medical History  Diagnosis Date  . Migraine headache with aura   . ADD (attention deficit disorder)   . Obesity      History   Social History Narrative   Marital Status:  Married ([place])   Children:  Sons (Carter/Brandon)     Pets: Dog (Buddy)    Living Situation: Lives with spouse and sons.    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 Heart disease Maternal Grandmother   . Heart disease Paternal Grandfather      Current Outpatient Prescriptions on File Prior to Visit  Medication Sig Dispense Refill  . Magnesium 500 MG TABS Take 1 tablet (500 mg total) by mouth daily.  30 tablet  11  . MICROGESTIN 1-20 MG-MCG tablet        No current facility-administered medications on file prior to visit.     No Known Allergies   Immunization History  Administered Date(s) Administered  . Tdap 04/17/2009       Objective:   Physical Exam  Constitutional: She appears well-nourished.  Neck: Normal range of motion.  Neurological: She is alert.  Skin: Skin is dry. Rash noted.         Assessment & Plan:    Shawna OrleansMelanie was seen today for rash.  Diagnoses and associated orders for this visit:  Rash and nonspecific skin eruption Comments:  Shawna OrleansMelanie appears to have poison ivy.  She was given a list of things to try.  After 2 weeks, she has continued to have new lesions. She did not get the Olux filled due to the cost.  We'll let her try some Lidex gel.  She'll do another 6 day Prednisone Dose Pak if she continues to struggle after applying the gel.    - clobetasol (OLUX) 0.05 % topical foam; Apply topically 2 (two) times daily. - predniSONE (STERAPRED UNI-PAK) 10 MG tablet; Take by mouth daily. Take as directed for 6 days - methylPREDNISolone sodium succinate (SOLU-MEDROL) 125 mg/2 mL injection 125 mg; Inject 2 mLs (125 mg total) into the muscle once. - fluocinonide gel (LIDEX) 0.05 %; Apply 1 application topically 2 (two) times daily. - predniSONE (STERAPRED UNI-PAK) 10 MG tablet; Take by mouth daily. Take as directed for 6 days  Concentration deficit -     lisdexamfetamine (VYVANSE) 40 MG capsule; Take 1 capsule (40 mg total) by mouth every morning.  Refilled for 3 months.

## 2014-05-16 NOTE — Patient Instructions (Signed)
1)  Poison Ivy    Ivarest  Olux 2 x per day  Zyrtec (10 mg) or Benadryl 25 mg at night/Allegra 180 mg during day  Sarna (Original) Fridge  Solumedrol (Shot)   Prednisone Dose Pak if needed.    Poison [employer] Poison ivy is a inflammation of the skin (contact dermatitis) caused by touching the allergens on the leaves of the ivy plant following previous exposure to the plant. The rash usually appears 48 hours after exposure. The rash is usually bumps (papules) or blisters (vesicles) in a linear pattern. Depending on your own sensitivity, the rash may simply cause redness and itching, or it may also progress to blisters which may break open. These must be well cared for to prevent secondary bacterial (germ) infection, followed by scarring. Keep any open areas dry, clean, dressed, and covered with an antibacterial ointment if needed. The eyes may also get puffy. The puffiness is worst in the morning and gets better as the day progresses. This dermatitis usually heals without scarring, within 2 to 3 weeks without treatment. HOME CARE INSTRUCTIONS  Thoroughly wash with soap and water as soon as you have been exposed to poison ivy. You have about one half hour to remove the plant resin before it will cause the rash. This washing will destroy the oil or antigen on the skin that is causing, or will cause, the rash. Be sure to wash under your fingernails as any plant resin there will continue to spread the rash. Do not rub skin vigorously when washing affected area. Poison ivy cannot spread if no oil from the plant remains on your body. A rash that has progressed to weeping sores will not spread the rash unless you have not washed thoroughly. It is also important to wash any clothes you have been wearing as these may carry active allergens. The rash will return if you wear the unwashed clothing, even several days later. Avoidance of the plant in the future is the best measure. Poison ivy plant can be recognized by  the number of leaves. Generally, poison ivy has three leaves with flowering branches on a single stem. Diphenhydramine may be purchased over the counter and used as needed for itching. Do not drive with this medication if it makes you drowsy.Ask your caregiver about medication for children. SEEK MEDICAL CARE IF:  Open sores develop.  Redness spreads beyond area of rash.  You notice purulent (pus-like) discharge.  You have increased pain.  Other signs of infection develop (such as fever). Document Released: 10/03/2000 Document Revised: 12/29/2011 Document Reviewed: 03/16/2009 [facility] Patient Information 2015 [place], [place]. This information is not intended to replace advice given to you by your health care provider. Make sure you discuss any questions you have with your health care provider.

## 2014-05-17 ENCOUNTER — Ambulatory Visit: Payer: Managed Care, Other (non HMO) | Admitting: Family Medicine

## 2014-05-30 MED ORDER — PREDNISONE (PAK) 10 MG PO TABS: ORAL_TABLET | Freq: Every day | ORAL | Status: AC

## 2014-05-30 MED ORDER — FLUOCINONIDE 0.05 % EX GEL: 1.0000 "application " | Freq: Two times a day (BID) | CUTANEOUS | Status: AC

## 2014-08-04 ENCOUNTER — Other Ambulatory Visit: Payer: Self-pay

## 2018-01-20 ENCOUNTER — Ambulatory Visit: Payer: Managed Care, Other (non HMO) | Admitting: Family Medicine
